# Patient Record
Sex: Male | Born: 1990
Health system: Southern US, Community
[De-identification: ages and names within clinical notes are randomized; demographics above are authoritative.]

## PROBLEM LIST (undated history)

## (undated) HISTORY — PX: FACIAL FRACTURE SURGERY: SHX1570

---

## 2015-01-07 ENCOUNTER — Encounter: Payer: Self-pay | Admitting: Emergency Medicine

## 2015-01-07 ENCOUNTER — Emergency Department
Admission: EM | Admit: 2015-01-07 | Discharge: 2015-01-08 | Disposition: A | Discharge status: Home / Self Care | Payer: Worker's Compensation | Attending: Emergency Medicine | Admitting: Emergency Medicine

## 2015-01-07 ENCOUNTER — Emergency Department: Payer: Worker's Compensation

## 2015-01-07 DIAGNOSIS — S62640B Nondisplaced fracture of proximal phalanx of right index finger, initial encounter for open fracture: Secondary | ICD-10-CM | POA: Diagnosis not present

## 2015-01-07 DIAGNOSIS — S62609B Fracture of unspecified phalanx of unspecified finger, initial encounter for open fracture: Secondary | ICD-10-CM

## 2015-01-07 DIAGNOSIS — Y9289 Other specified places as the place of occurrence of the external cause: Secondary | ICD-10-CM | POA: Diagnosis not present

## 2015-01-07 DIAGNOSIS — Z23 Encounter for immunization: Secondary | ICD-10-CM | POA: Diagnosis not present

## 2015-01-07 DIAGNOSIS — S61210A Laceration without foreign body of right index finger without damage to nail, initial encounter: Secondary | ICD-10-CM | POA: Insufficient documentation

## 2015-01-07 DIAGNOSIS — Y990 Civilian activity done for income or pay: Secondary | ICD-10-CM | POA: Insufficient documentation

## 2015-01-07 DIAGNOSIS — Y9389 Activity, other specified: Secondary | ICD-10-CM | POA: Insufficient documentation

## 2015-01-07 DIAGNOSIS — W230XXA Caught, crushed, jammed, or pinched between moving objects, initial encounter: Secondary | ICD-10-CM | POA: Insufficient documentation

## 2015-01-07 DIAGNOSIS — S6991XA Unspecified injury of right wrist, hand and finger(s), initial encounter: Secondary | ICD-10-CM | POA: Diagnosis present

## 2015-01-07 DIAGNOSIS — S61219A Laceration without foreign body of unspecified finger without damage to nail, initial encounter: Secondary | ICD-10-CM

## 2015-01-07 MED ORDER — OXYCODONE-ACETAMINOPHEN 5-325 MG PO TABS
1.0000 | ORAL_TABLET | Freq: Once | ORAL | Status: AC
  Administered 2015-01-07: 1 via ORAL

## 2015-01-07 MED ORDER — LIDOCAINE HCL (PF) 1 % IJ SOLN
INTRAMUSCULAR | Status: AC
  Filled 2015-01-07: qty 5

## 2015-01-07 MED ORDER — OXYCODONE-ACETAMINOPHEN 5-325 MG PO TABS
ORAL_TABLET | ORAL | Status: AC
  Administered 2015-01-07: 1 via ORAL
  Filled 2015-01-07: qty 1

## 2015-01-07 MED ORDER — CEFAZOLIN SODIUM 1-5 GM-% IV SOLN
1.0000 g | Freq: Once | INTRAVENOUS | Status: AC
  Administered 2015-01-07: 1 g via INTRAVENOUS

## 2015-01-07 MED ORDER — CEFAZOLIN SODIUM 1-5 GM-% IV SOLN
INTRAVENOUS | Status: AC
  Administered 2015-01-07: 1 g via INTRAVENOUS
  Filled 2015-01-07: qty 50

## 2015-01-07 NOTE — ED Provider Notes (Signed)
[facility] Emergency Department Provider Note ____________________________________________  Time seen: Approximately 11:09 PM  I have reviewed the triage vital signs and the nursing notes.   HISTORY  Chief Complaint Finger Injury   HPI Christopher Rich is a 24 y.o. male who presents to the ER for an injury to the 2nd digit of the right hand. He reports that his finger caught in a pump that was not supposed to turn on. He states that originally the finger was twisted and deformed, but he was able to "pull it back into place a little bit." He also has a laceration to the 2nd digit as well.  History reviewed. No pertinent past medical history.  There are no active problems to display for this patient.   Past Surgical History  Procedure Laterality Date  . Facial fracture surgery      Current Outpatient Rx  Name  Route  Sig  Dispense  Refill  . cephALEXin (KEFLEX) 500 MG capsule   Oral   Take 1 capsule (500 mg total) by mouth 3 (three) times daily.   40 capsule   0   . oxyCODONE-acetaminophen (ROXICET) 5-325 MG per tablet   Oral   Take 1 tablet by mouth every 6 (six) hours as needed.   12 tablet   0     Allergies Review of patient's allergies indicates no known allergies.  No family history on file.  Social History History  Substance Use Topics  . Smoking status: Never Smoker   . Smokeless tobacco: Not on file  . Alcohol Use: No    Review of Systems Constitutional: No recent illness. Eyes: No visual changes. ENT: No sore throat. Cardiovascular: Denies chest pain or palpitations. Respiratory: Denies shortness of breath. Gastrointestinal: No abdominal pain.  Genitourinary: Negative for dysuria. Musculoskeletal: Pain in right hand, 2nd digit Skin: Negative for rash. Neurological: Negative for headaches, focal weakness or numbness. 10-point ROS otherwise negative.  ____________________________________________   PHYSICAL  EXAM:  VITAL SIGNS: ED Triage Vitals  Enc Vitals Group     BP 01/07/15 2126 156/80 mmHg     Pulse Rate 01/07/15 2126 67     Resp 01/07/15 2126 18     Temp 01/07/15 2126 97.5 F (36.4 C)     Temp Source 01/07/15 2126 Oral     SpO2 01/07/15 2126 99 %     Weight 01/07/15 2126 200 lb (90.719 kg)     Height 01/07/15 2126 6\' 1"  (1.854 m)     Head Cir --      Peak Flow --      Pain Score 01/07/15 2126 4     Pain Loc --      Pain Edu? --      Excl. in GC? --     Constitutional: Alert and oriented. Well appearing and in no acute distress. Eyes: Conjunctivae are normal. EOMI. Head: Atraumatic. Nose: No congestion/rhinnorhea. Neck: No stridor.  Respiratory: Normal respiratory effort.   Musculoskeletal: Laceration to the dorsal aspect of the 2nd digit concerning for an open fracture. Second laceration at fingertip with continuous scant bleeding. Swelling and deformity noted as well. Neurologic:  Normal speech and language. No gross focal neurologic deficits are appreciated. Speech is normal. No gait instability. Skin:  Skin is warm, dry and intact. Atraumatic. Psychiatric: Mood and affect are normal. Speech and behavior are normal.  ____________________________________________   LABS (all labs ordered are listed, but only abnormal results are displayed)  Labs Reviewed - No data  to display ____________________________________________  RADIOLOGY  Image viewed by me. Oblique, displaced, slightly rotated fracture of the  Second proximal phalanx. ____________________________________________   PROCEDURES  Procedure(s) performed: LACERATION REPAIR Performed by: Kem Boroughs Authorized by: Kem Boroughs Consent: Verbal consent obtained. Risks and benefits: risks, benefits and alternatives were discussed Consent given by: patient Patient identity confirmed: provided demographic data Prepped and Draped in normal sterile fashion Wound explored  Laceration Location: Right hand,  2nd digit, fingertip  Laceration Length: 2cm  No Foreign Bodies seen or palpated  Anesthesia: local infiltration  Local anesthetic: lidocaine 1%   Anesthetic total: 3 ml  Irrigation method: syringe Amount of cleaning: standard  Skin closure: 5-0 Vicryl  Number of sutures: 6  Technique: Simple interrupted  Patient tolerance: Patient tolerated the procedure well with no immediate complications.    ____________________________________________   INITIAL IMPRESSION / ASSESSMENT AND PLAN / ED COURSE  Pertinent labs & imaging results that were available during my care of the patient were reviewed by me and considered in my medical decision making (see chart for details).  After cleaning and further exam of the laceration to the dorsal laceration, it is questionable to if the fracture is open or not. Skin is deeply abrased, but does not require closure. Will treat conservatively and give IV Ancef and continue on antibiotics outpatient.   Closure was required to fingertip--See procedure note.   Aluminum foam splint was applied to 2nd digit in functional position by ER tech. Neurovascularly intact post application. Wound care and splint care was discussed with patient and family.  He understands to call on Monday for a follow up appointment with the orthopedic doctor. He was advised to return to the ER for any symptom of concern if he is unable to see the specialist.  ____________________________________________   FINAL CLINICAL IMPRESSION(S) / ED DIAGNOSES  Final diagnoses:  Phalanx (hand) fracture, open, initial encounter  Laceration of finger of right hand, initial encounter      Chinita Pester, FNP 01/08/15 0020  Darien Ramus, MD 01/09/15 [phone]

## 2015-01-07 NOTE — ED Notes (Signed)
Pt to ED c/o R index finger injury while at work. 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

## 2015-01-08 MED ORDER — CEPHALEXIN 500 MG PO CAPS: 500.0000 mg | ORAL_CAPSULE | Freq: Three times a day (TID) | ORAL | Status: AC

## 2015-01-08 MED ORDER — TETANUS-DIPHTH-ACELL PERTUSSIS 5-2.5-18.5 LF-MCG/0.5 IM SUSP
INTRAMUSCULAR | Status: AC
  Administered 2015-01-08: 0.5 mL via INTRAMUSCULAR
  Filled 2015-01-08: qty 0.5

## 2015-01-08 MED ORDER — OXYCODONE-ACETAMINOPHEN 5-325 MG PO TABS: 1.0000 | ORAL_TABLET | Freq: Four times a day (QID) | ORAL | Status: DC | PRN

## 2015-01-08 MED ORDER — TETANUS-DIPHTH-ACELL PERTUSSIS 5-2.5-18.5 LF-MCG/0.5 IM SUSP
0.5000 mL | Freq: Once | INTRAMUSCULAR | Status: AC
  Administered 2015-01-08: 0.5 mL via INTRAMUSCULAR

## 2015-01-08 NOTE — Discharge Instructions (Signed)
Please call Monday morning to schedule a follow up appointment with the orthopedic doctor. Return to the ER for symptoms of concern if you are unable to see the specialist. Take the antibiotic as prescribed and pain medication as needed. If the dressing gets wet, please remove it and apply new. Otherwise, do not remove the splint.

## 2015-01-20 ENCOUNTER — Ambulatory Visit: Payer: Worker's Compensation | Admitting: Registered Nurse

## 2015-01-20 ENCOUNTER — Ambulatory Visit
Admission: RE | Admit: 2015-01-20 | Discharge: 2015-01-20 | Disposition: A | Discharge status: Home / Self Care | Payer: Worker's Compensation | Source: Ambulatory Visit | Attending: Surgery | Admitting: Surgery

## 2015-01-20 ENCOUNTER — Encounter: Payer: Self-pay | Admitting: *Deleted

## 2015-01-20 ENCOUNTER — Encounter
Admission: RE | Disposition: A | Discharge status: Home / Self Care | Payer: Self-pay | Source: Ambulatory Visit | Attending: Surgery

## 2015-01-20 DIAGNOSIS — S62611B Displaced fracture of proximal phalanx of left index finger, initial encounter for open fracture: Secondary | ICD-10-CM | POA: Insufficient documentation

## 2015-01-20 DIAGNOSIS — S62610A Displaced fracture of proximal phalanx of right index finger, initial encounter for closed fracture: Secondary | ICD-10-CM | POA: Diagnosis present

## 2015-01-20 DIAGNOSIS — W3189XA Contact with other specified machinery, initial encounter: Secondary | ICD-10-CM | POA: Diagnosis not present

## 2015-01-20 HISTORY — PX: OPEN REDUCTION INTERNAL FIXATION (ORIF) METACARPAL: SHX6234

## 2015-01-20 SURGERY — OPEN REDUCTION INTERNAL FIXATION (ORIF) METACARPAL
Anesthesia: General | Laterality: Right | Wound class: Clean

## 2015-01-20 MED ORDER — FENTANYL CITRATE (PF) 100 MCG/2ML IJ SOLN
INTRAMUSCULAR | Status: AC
  Administered 2015-01-20: 25 ug via INTRAVENOUS
  Filled 2015-01-20: qty 2

## 2015-01-20 MED ORDER — BUPIVACAINE HCL (PF) 0.5 % IJ SOLN
INTRAMUSCULAR | Status: DC | PRN
  Administered 2015-01-20: 10 mL

## 2015-01-20 MED ORDER — KETOROLAC TROMETHAMINE 30 MG/ML IJ SOLN
INTRAMUSCULAR | Status: DC | PRN
  Administered 2015-01-20: 30 mg via INTRAVENOUS

## 2015-01-20 MED ORDER — OXYCODONE HCL 5 MG PO TABS: 5.0000 mg | ORAL_TABLET | ORAL | Status: DC | PRN

## 2015-01-20 MED ORDER — FAMOTIDINE 20 MG PO TABS
ORAL_TABLET | ORAL | Status: AC
  Administered 2015-01-20: 20 mg
  Filled 2015-01-20: qty 1

## 2015-01-20 MED ORDER — CEFAZOLIN SODIUM-DEXTROSE 2-3 GM-% IV SOLR
2.0000 g | Freq: Once | INTRAVENOUS | Status: AC
  Administered 2015-01-20: 2 g via INTRAVENOUS

## 2015-01-20 MED ORDER — OXYCODONE HCL 5 MG PO TABS
ORAL_TABLET | ORAL | Status: AC
  Administered 2015-01-20: 5 mg via ORAL
  Filled 2015-01-20: qty 1

## 2015-01-20 MED ORDER — ACETAMINOPHEN 10 MG/ML IV SOLN
INTRAVENOUS | Status: DC | PRN
  Administered 2015-01-20: 1000 mg via INTRAVENOUS

## 2015-01-20 MED ORDER — ONDANSETRON HCL 4 MG/2ML IJ SOLN
INTRAMUSCULAR | Status: DC | PRN
Start: 2015-01-20 — End: 2015-01-20
  Administered 2015-01-20: 4 mg via INTRAVENOUS

## 2015-01-20 MED ORDER — ONDANSETRON HCL 4 MG/2ML IJ SOLN: 4.0000 mg | Freq: Once | INTRAMUSCULAR | Status: DC | PRN

## 2015-01-20 MED ORDER — LIDOCAINE HCL 2 % EX GEL
CUTANEOUS | Status: DC | PRN
  Administered 2015-01-20: 1 via TOPICAL

## 2015-01-20 MED ORDER — PROPOFOL 10 MG/ML IV BOLUS
INTRAVENOUS | Status: DC | PRN
  Administered 2015-01-20: 100 mg via INTRAVENOUS
  Administered 2015-01-20: 50 mg via INTRAVENOUS
  Administered 2015-01-20: 200 mg via INTRAVENOUS

## 2015-01-20 MED ORDER — CEFAZOLIN SODIUM-DEXTROSE 2-3 GM-% IV SOLR
INTRAVENOUS | Status: AC
  Filled 2015-01-20: qty 50

## 2015-01-20 MED ORDER — NEOMYCIN-POLYMYXIN B GU 40-200000 IR SOLN
Status: DC | PRN
  Administered 2015-01-20: 2 mL

## 2015-01-20 MED ORDER — GLYCOPYRROLATE 0.2 MG/ML IJ SOLN
INTRAMUSCULAR | Status: DC | PRN
  Administered 2015-01-20: 0.2 mg via INTRAVENOUS

## 2015-01-20 MED ORDER — ACETAMINOPHEN 10 MG/ML IV SOLN
INTRAVENOUS | Status: AC
  Filled 2015-01-20: qty 100

## 2015-01-20 MED ORDER — FAMOTIDINE 20 MG PO TABS: 20.0000 mg | ORAL_TABLET | Freq: Once | ORAL | Status: DC

## 2015-01-20 MED ORDER — FENTANYL CITRATE (PF) 100 MCG/2ML IJ SOLN
INTRAMUSCULAR | Status: DC | PRN
  Administered 2015-01-20 (×2): 50 ug via INTRAVENOUS

## 2015-01-20 MED ORDER — LACTATED RINGERS IV SOLN
INTRAVENOUS | Status: DC
  Administered 2015-01-20 (×2): via INTRAVENOUS

## 2015-01-20 MED ORDER — MIDAZOLAM HCL 2 MG/2ML IJ SOLN
INTRAMUSCULAR | Status: DC | PRN
  Administered 2015-01-20: 2 mg via INTRAVENOUS

## 2015-01-20 MED ORDER — METOPROLOL TARTRATE 1 MG/ML IV SOLN
INTRAVENOUS | Status: DC | PRN
  Administered 2015-01-20: 5 mg via INTRAVENOUS

## 2015-01-20 MED ORDER — SUCCINYLCHOLINE CHLORIDE 20 MG/ML IJ SOLN
INTRAMUSCULAR | Status: DC | PRN
  Administered 2015-01-20: 100 mg via INTRAVENOUS
  Administered 2015-01-20: 10 mg via INTRAVENOUS

## 2015-01-20 MED ORDER — LIDOCAINE HCL (CARDIAC) 20 MG/ML IV SOLN
INTRAVENOUS | Status: DC | PRN
  Administered 2015-01-20: 100 mg via INTRAVENOUS

## 2015-01-20 MED ORDER — BUPIVACAINE HCL (PF) 0.5 % IJ SOLN
INTRAMUSCULAR | Status: AC
  Filled 2015-01-20: qty 30

## 2015-01-20 MED ORDER — OXYCODONE HCL 5 MG PO TABS
5.0000 mg | ORAL_TABLET | ORAL | Status: DC | PRN
  Administered 2015-01-20: 5 mg via ORAL

## 2015-01-20 MED ORDER — FENTANYL CITRATE (PF) 100 MCG/2ML IJ SOLN
25.0000 ug | INTRAMUSCULAR | Status: DC | PRN
  Administered 2015-01-20 (×4): 25 ug via INTRAVENOUS

## 2015-01-20 MED ORDER — DEXAMETHASONE SODIUM PHOSPHATE 4 MG/ML IJ SOLN
INTRAMUSCULAR | Status: DC | PRN
  Administered 2015-01-20: 10 mg via INTRAVENOUS

## 2015-01-20 SURGICAL SUPPLY — 37 items
1.5 COUNTER SINK ×3 IMPLANT
BANDAGE STRETCH 3X4.1 STRL (GAUZE/BANDAGES/DRESSINGS) ×3 IMPLANT
BIT DRILL 1.1 (BIT) ×1
BIT DRILL 1.1MM (BIT) ×1
BIT DRILL 60X20X1.1XQC TMX (BIT) ×1 IMPLANT
BIT DRL 60X20X1.1XQC TMX (BIT) ×1
BNDG ESMARK 4X12 TAN STRL LF (GAUZE/BANDAGES/DRESSINGS) ×3 IMPLANT
BNDG GAUZE 1X2.1 STRL (MISCELLANEOUS) ×3 IMPLANT
CHLORAPREP W/TINT 26ML (MISCELLANEOUS) ×3 IMPLANT
CORD BIP STRL DISP 12FT (MISCELLANEOUS) ×3 IMPLANT
COUNTERSINK 1.3MM/1.5MM (INSTRUMENTS) ×3
DRAPE FLUOR MINI C-ARM 54X84 (DRAPES) ×3 IMPLANT
DRIVER BIT 1.5 (TRAUMA) ×3 IMPLANT
FORCEPS JEWEL BIP 4-3/4 STR (INSTRUMENTS) ×3 IMPLANT
GAUZE PETRO XEROFOAM 1X8 (MISCELLANEOUS) ×3 IMPLANT
GAUZE SPONGE 4X4 12PLY STRL (GAUZE/BANDAGES/DRESSINGS) ×3 IMPLANT
GLOVE BIO SURGEON STRL SZ8 (GLOVE) ×6 IMPLANT
GLOVE INDICATOR 8.0 STRL GRN (GLOVE) ×3 IMPLANT
GOWN STRL REUS W/ TWL LRG LVL3 (GOWN DISPOSABLE) ×1 IMPLANT
GOWN STRL REUS W/ TWL XL LVL3 (GOWN DISPOSABLE) ×1 IMPLANT
GOWN STRL REUS W/TWL LRG LVL3 (GOWN DISPOSABLE) ×2
GOWN STRL REUS W/TWL XL LVL3 (GOWN DISPOSABLE) ×2
NDL SAFETY 25GX1.5 (NEEDLE) ×3 IMPLANT
NS IRRIG 500ML POUR BTL (IV SOLUTION) ×3 IMPLANT
PACK EXTREMITY ARMC (MISCELLANEOUS) ×3 IMPLANT
PASSER SUT SWANSON 36MM LOOP (INSTRUMENTS) IMPLANT
SCREW 1.5X15MM (Screw) ×3 IMPLANT
SCREW COUNTERSINK 1.3MM/1.5MM (INSTRUMENTS) ×1 IMPLANT
SCREW NONIOC 1.5 14M (Screw) IMPLANT
SCREW NONIOC 1.5 16M (Screw) ×6 IMPLANT
STOCKINETTE STRL 4IN 9604848 (GAUZE/BANDAGES/DRESSINGS) IMPLANT
STRAP SAFETY BODY (MISCELLANEOUS) ×3 IMPLANT
SUT PROLENE 4 0 PS 2 18 (SUTURE) ×3 IMPLANT
SUT PROLENE 6 0 P 1 18 (SUTURE) ×3 IMPLANT
SUT VIC AB 5-0 TF 27 (SUTURE) ×3 IMPLANT
SYRINGE 10CC LL (SYRINGE) ×3 IMPLANT
WIRE Z .062 C-WIRE SPADE TIP (WIRE) IMPLANT

## 2015-01-20 NOTE — Transfer of Care (Signed)
Immediate Anesthesia Transfer of Care Note  Patient: Christopher Rich  Procedure(s) Performed: Procedure(s): OPEN REDUCTION INTERNAL FIXATION (ORIF) Right index proximal pharanx (Right)  Patient Location: PACU  Anesthesia Type:General  Level of Consciousness: sedated  Airway & Oxygen Therapy: Patient Spontanous Breathing and Patient connected to face mask oxygen  Post-op Assessment: Report given to RN and Post -op Vital signs reviewed and stable  Post vital signs: Reviewed and stable  Last Vitals:  Filed Vitals:   01/20/15 0914  BP: 126/82  Pulse: 74  Temp: 36 C  Resp: 16    Complications: No apparent anesthesia complications

## 2015-01-20 NOTE — Addendum Note (Signed)
Addendum  created 01/20/15 1523 by Stormy FabianLinda Kalea Perine, CRNA   Modules edited: Charges VN

## 2015-01-20 NOTE — Anesthesia Procedure Notes (Addendum)
Procedure Name: LMA Insertion Date/Time: 01/20/2015 7:14 AM Performed by: Stormy FabianURTIS, Matther Labell Pre-anesthesia Checklist: Patient identified, Patient being monitored, Timeout performed, Emergency Drugs available and Suction available Patient Re-evaluated:Patient Re-evaluated prior to inductionOxygen Delivery Method: Circle system utilized Preoxygenation: Pre-oxygenation with 100% oxygen Intubation Type: IV induction Ventilation: Mask ventilation without difficulty LMA: LMA inserted LMA Size: 4.5 Tube type: Oral Number of attempts: 1 Placement Confirmation: positive ETCO2 and breath sounds checked- equal and bilateral Tube secured with: Tape Dental Injury: Teeth and Oropharynx as per pre-operative assessment    Procedure Name: Intubation Date/Time: 01/20/2015 7:19 AM Performed by: Stormy FabianURTIS, Maximilliano Kersh Pre-anesthesia Checklist: Patient identified, Patient being monitored, Timeout performed, Emergency Drugs available and Suction available Patient Re-evaluated:Patient Re-evaluated prior to inductionOxygen Delivery Method: Circle system utilized Preoxygenation: Pre-oxygenation with 100% oxygen Intubation Type: IV induction Ventilation: Mask ventilation without difficulty Laryngoscope Size: Mac and 3 Grade View: Grade I Tube type: Oral Tube size: 7.5 mm Number of attempts: 1 Airway Equipment and Method: Stylet Placement Confirmation: ETT inserted through vocal cords under direct vision,  positive ETCO2 and breath sounds checked- equal and bilateral Secured at: 22 cm Tube secured with: Tape Dental Injury: Teeth and Oropharynx as per pre-operative assessment  Comments: Patient had a laryngospasm- LMA removed. Patient intubated without difficulty. Patient has an anterior airway

## 2015-01-20 NOTE — Discharge Instructions (Addendum)
Keep splint dry and intact. Keep hand elevated above heart level. Apply ice to affected area frequently. Return for follow-up in 10-14 days or as scheduled.   AMBULATORY SURGERY  DISCHARGE INSTRUCTIONS   1) The drugs that you were given will stay in your system until tomorrow so for the next 24 hours you should not:  A) Drive an automobile B) Make any legal decisions C) Drink any alcoholic beverage   2) You may resume regular meals tomorrow.  Today it is better to start with liquids and gradually work up to solid foods.  You may eat anything you prefer, but it is better to start with liquids, then soup and crackers, and gradually work up to solid foods.   3) Please notify your doctor immediately if you have any unusual bleeding, trouble breathing, redness and pain at the surgery site, drainage, fever, or pain not relieved by medication.  4) Your post-operative visit with Dr.                                     is: Date:                        Time:    Please call to schedule your post-operative visit.  5) Additional Instructions: 6)

## 2015-01-20 NOTE — H&P (Signed)
Paper H&P to be scanned into permanent record. H&P reviewed. No changes. 

## 2015-01-20 NOTE — Op Note (Signed)
01/20/2015  8:58 AM  Patient:   Christopher Rich  Pre-Op Diagnosis:   Grade 1 open displaced fracture right index proximal phalanx.  Post-Op Diagnosis:   Same  Procedure:   Open reduction and internal fixation of right index P1 fracture.  Surgeon:   Maryagnes AmosJ. Jeffrey Poggi, MD  Assistant:   None  Anesthesia:   General endotracheal  Findings:   As above.  Complications:   None  EBL:   1 cc  Fluids:   1000 cc crystalloid  TT:   70 minutes at 250 mmHg  Drains:   None  Closure:   40 proline interrupted sutures  Implants:   Biomet 1.5 mm nonlocking mini-frag screws 3  Brief Clinical Note:   The patient is a 24 year old male who sustained the above-noted injury 13 days ago secondary to a work-related injury. He was seen in the emergency room where x-rays demonstrated the above-noted fracture. He was splinted and given antibiotics. He presents at this time for definitive management of his injury.  Procedure:   The patient was brought into the operating room and lain in the supine position. After an attempt at general laryngal mask anesthesia was performed, the patient developed some laryngospasm. Therefore, the anesthetic was switched to general endotracheal intubation and anesthesia. After site verification, the right hand and upper extremity were prepped with DuraPrep solution before being draped sterilely. Preoperative antibiotics were administered. A curvilinear incision was made over the dorsal aspect of the index finger beginning at the PIP extension crease and extending to just distal to the MCP extension crease. The incision was carried down through the subcutaneous tissues with care taken to identify and protect as many dorsal veins as possible. The extensor tendon was incised longitudinally and retracted radially and ulnarly respectively. The underlying periosteum also was divided longitudinally to expose the fracture site. The fracture hematoma was debrided using a mini curet and small  pickups, as well as some irrigation before the fracture was reduced and temporarily secured using bone-holding tenaculums. The adequacy of fracture reduction was verified using Orthoscan imaging in AP, oblique, and lateral projections. After several minor adjustments, satisfactory reduction was achieved. The fracture was secured using three radially to ulnarly directed Biomet 1.5 mm nonlocking mini frag cortical screws. The adequacy of screw position and fracture reduction again was verified using the Orthoscan imager in AP, lateral, and oblique projections and found to be excellent. Finger alignment and rotation also was verified by flexing the digit.  The wound was copiously irrigated with sterile saline solution before the periosteal layer was reapproximated using 5-0 Vicryl interrupted sutures. The extensor tendon itself was reapproximated using a running 5-0 proline suture. The skin was closed using 4-0 proline interrupted sutures. A sterile bulky dressing was applied to the incision before the index and long fingers were buddy taped and splinted with the PIP and DIP joints in extension, yet permitting MCP flexion. The patient was then awakened, extubated, and returned to the recovery room in satisfactory condition after tolerating the procedure well.

## 2015-01-20 NOTE — Progress Notes (Signed)
Ice pack applied to right hand, elevated on pillow.

## 2015-01-20 NOTE — Anesthesia Preprocedure Evaluation (Signed)
Anesthesia Evaluation  Patient identified by MRN, date of birth, ID band Patient awake    Reviewed: Allergy & Precautions, NPO status , Patient's Chart, lab work & pertinent test results  Airway Mallampati: I  TM Distance: >3 FB Neck ROM: Full    Dental no notable dental hx.    Pulmonary neg pulmonary ROS,    Pulmonary exam normal       Cardiovascular negative cardio ROS Normal cardiovascular exam    Neuro/Psych negative neurological ROS  negative psych ROS   GI/Hepatic negative GI ROS, Neg liver ROS,   Endo/Other  negative endocrine ROS  Renal/GU negative Renal ROS  negative genitourinary   Musculoskeletal negative musculoskeletal ROS (+)   Abdominal Normal abdominal exam  (+)   Peds negative pediatric ROS (+)  Hematology negative hematology ROS (+)   Anesthesia Other Findings   Reproductive/Obstetrics                             Anesthesia Physical Anesthesia Plan  ASA: I  Anesthesia Plan: General   Post-op Pain Management:    Induction: Intravenous  Airway Management Planned: LMA  Additional Equipment:   Intra-op Plan:   Post-operative Plan: Extubation in OR  Informed Consent: I have reviewed the patients History and Physical, chart, labs and discussed the procedure including the risks, benefits and alternatives for the proposed anesthesia with the patient or authorized representative who has indicated his/her understanding and acceptance.   Dental advisory given  Plan Discussed with: CRNA and Surgeon  Anesthesia Plan Comments:         Anesthesia Quick Evaluation

## 2015-01-20 NOTE — Anesthesia Postprocedure Evaluation (Signed)
  Anesthesia Post-op Note  Patient: Christopher Rich  Procedure(s) Performed: Procedure(s): OPEN REDUCTION INTERNAL FIXATION (ORIF) Right index proximal pharanx (Right)  Anesthesia type:General  Patient location: PACU  Post pain: Pain level controlled  Post assessment: Post-op Vital signs reviewed, Patient's Cardiovascular Status Stable, Respiratory Function Stable, Patent Airway and No signs of Nausea or vomiting  Post vital signs: Reviewed and stable  Last Vitals:  Filed Vitals:   01/20/15 1037  BP: 125/79  Pulse:   Temp:   Resp: 16    Level of consciousness: awake, alert  and patient cooperative  Complications: No apparent anesthesia complications... Mild partial laryngospasm at start of case resolved with positive pressure... Pt did well

## 2015-01-21 ENCOUNTER — Encounter: Payer: Self-pay | Admitting: Surgery

## 2017-02-22 IMAGING — CR DG FINGER INDEX 2+V*R*
1 series · 3 of 3 positions shown · non-contrast
Comparison: None.

CLINICAL DATA: RIGHT finger injury at work. Caught finger in moving
machine part.

EXAM:
RIGHT INDEX FINGER 2+V

[Series 1: x finger pa right · 0.14mm/px · 3 of 3 slices shown]
[im 1/3]
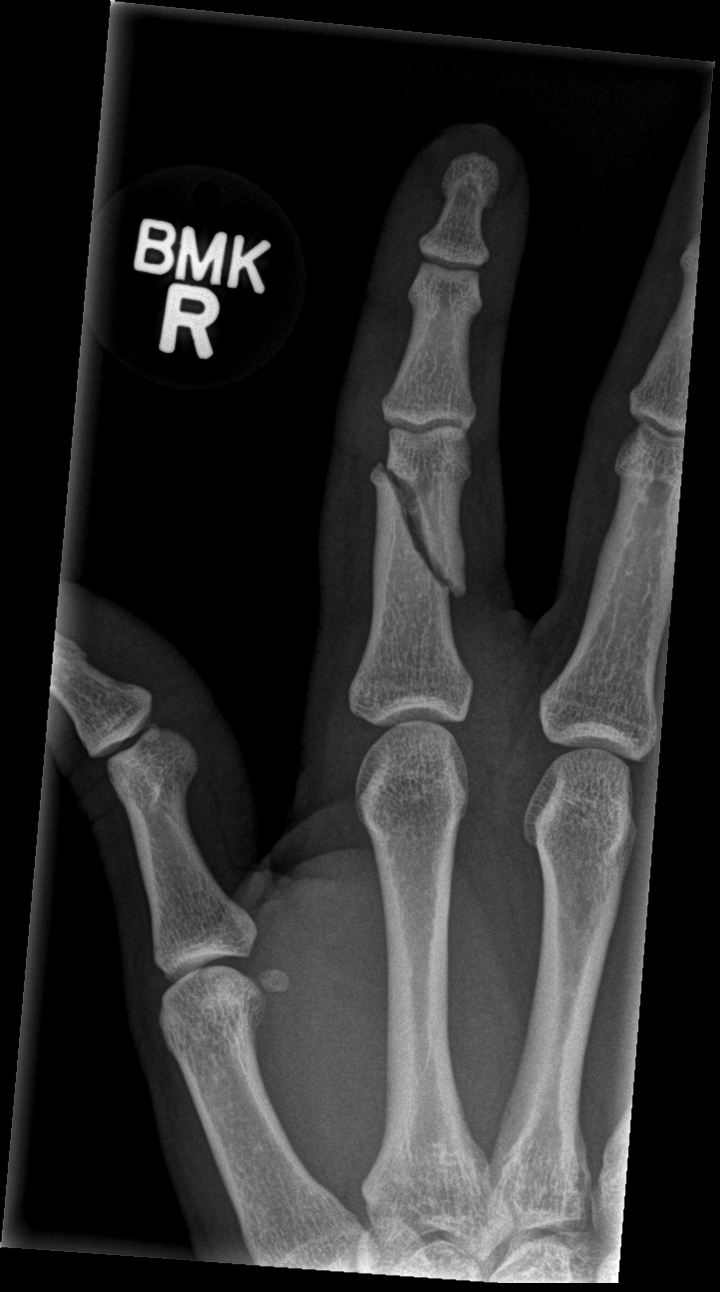
[im 2/3]
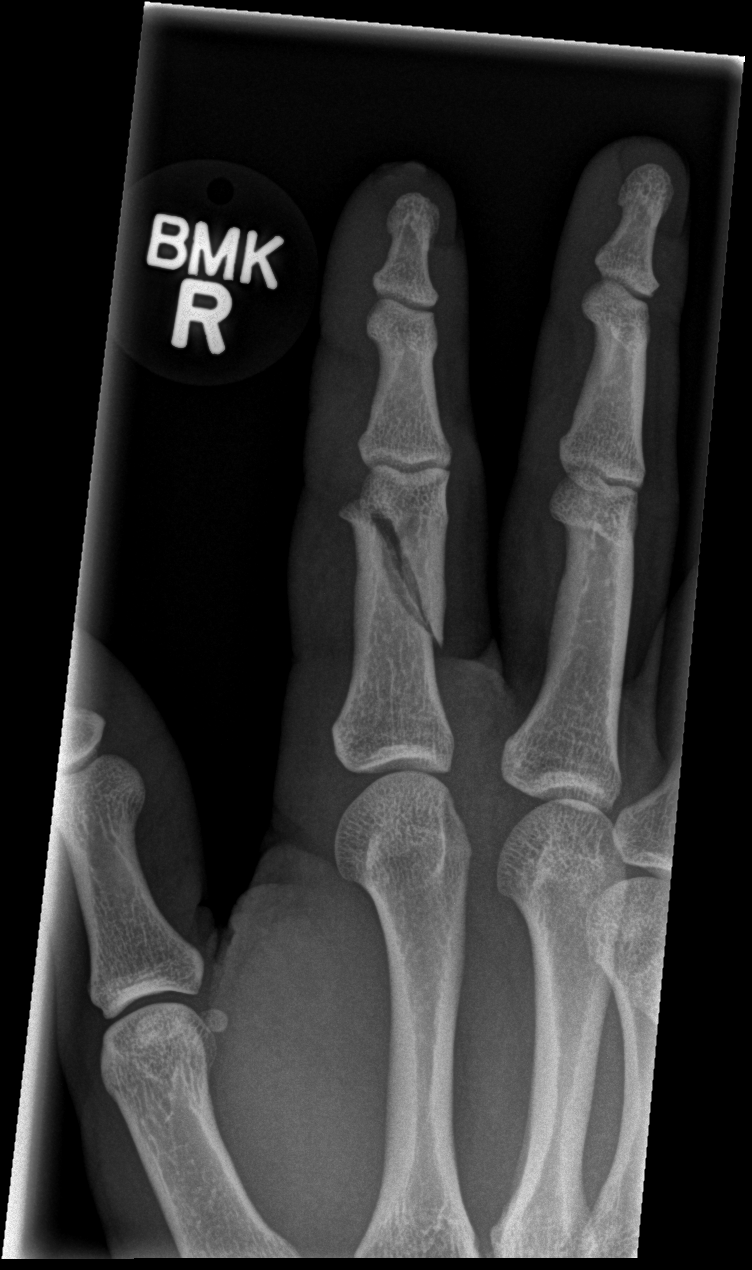
[im 3/3]
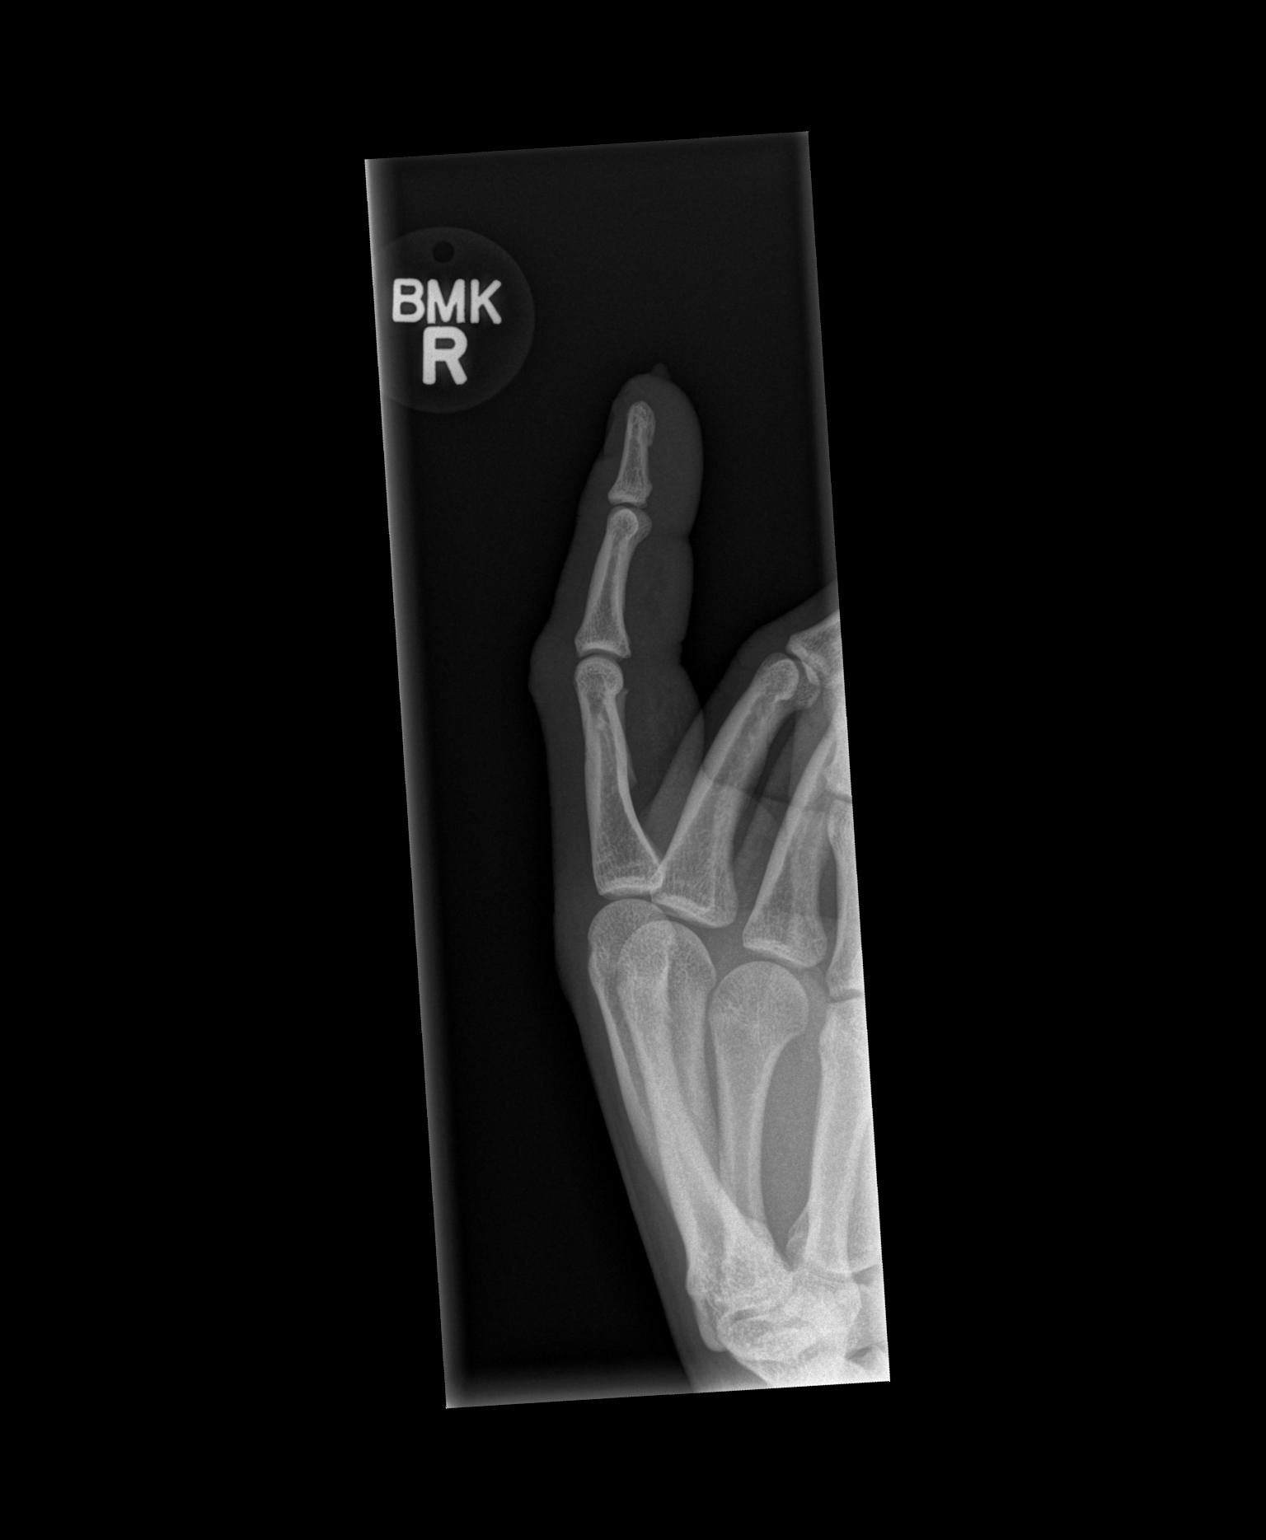

[3 of 3 positions shown; findings below may reference images not displayed]

FINDINGS: Oblique mildly impacted nondisplaced second proximal phalanx
fracture without intra-articular extension. Mild apparent bony
reabsorption of the fracture line. No dislocation. No destructive
bony lesions. Second digit tuft soft tissue irregularity may reflect
laceration without subcutaneous gas nor radiopaque foreign bodies.
IMPRESSION: Acute to subacute appearing nondisplaced second proximal phalanx
fracture without dislocation.

Suspected tuft soft tissue defect likely representing laceration
without radiopaque foreign bodies.

## 2020-10-20 ENCOUNTER — Other Ambulatory Visit: Payer: Self-pay

## 2020-10-20 ENCOUNTER — Emergency Department
Admission: RE | Admit: 2020-10-20 | Discharge: 2020-10-20 | Disposition: A | Discharge status: Home / Self Care | Payer: Self-pay | Source: Ambulatory Visit | Attending: Emergency Medicine | Admitting: Emergency Medicine

## 2020-10-20 VITALS — BP 136/90 | HR 63 | Temp 97.6°F | Resp 17

## 2020-10-20 DIAGNOSIS — T300 Burn of unspecified body region, unspecified degree: Secondary | ICD-10-CM | POA: Diagnosis not present

## 2020-10-20 DIAGNOSIS — Z23 Encounter for immunization: Secondary | ICD-10-CM

## 2020-10-20 MED ORDER — IBUPROFEN 600 MG PO TABS: 600.0000 mg | ORAL_TABLET | Freq: Four times a day (QID) | ORAL | 0 refills | Status: DC | PRN

## 2020-10-20 MED ORDER — HYDROCODONE-ACETAMINOPHEN 5-325 MG PO TABS: 1.0000 | ORAL_TABLET | Freq: Four times a day (QID) | ORAL | 0 refills | Status: DC | PRN

## 2020-10-20 MED ORDER — TETANUS-DIPHTH-ACELL PERTUSSIS 5-2.5-18.5 LF-MCG/0.5 IM SUSY
0.5000 mL | PREFILLED_SYRINGE | Freq: Once | INTRAMUSCULAR | Status: AC
  Administered 2020-10-20: 0.5 mL via INTRAMUSCULAR
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
## 2020-10-20 NOTE — Discharge Instructions (Addendum)
I have updated your tetanus.  You may take 600 mg of ibuprofen combined with Tylenol-containing product 3-4 times a day.  Either ibuprofen with 1000 mg of Tylenol for mild to moderate pain, or ibuprofen with 1-2 Norco for severe pain or for dressing changes.  Do not take Tylenol and Norco at the same time because they both have Tylenol in them and too much Tylenol can hurt your liver.  You can put bacitracin on this to help prevent it from sticking. Follow-up with the [facility] burn center ASAP.  This is going to take a while to heal.
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
## 2022-04-15 ENCOUNTER — Other Ambulatory Visit: Payer: Self-pay

## 2022-04-15 ENCOUNTER — Emergency Department
Admission: EM | Admit: 2022-04-15 | Discharge: 2022-04-15 | Disposition: A | Discharge status: Home / Self Care | Payer: No Typology Code available for payment source | Attending: Emergency Medicine | Admitting: Emergency Medicine

## 2022-04-15 ENCOUNTER — Encounter: Payer: Self-pay | Admitting: Intensive Care

## 2022-04-15 DIAGNOSIS — J020 Streptococcal pharyngitis: Secondary | ICD-10-CM | POA: Insufficient documentation

## 2022-04-15 DIAGNOSIS — R509 Fever, unspecified: Secondary | ICD-10-CM | POA: Diagnosis not present

## 2022-04-15 DIAGNOSIS — J029 Acute pharyngitis, unspecified: Secondary | ICD-10-CM | POA: Diagnosis present

## 2022-04-15 LAB — GROUP A STREP BY PCR: Group A Strep by PCR: DETECTED — AB

## 2022-04-15 MED ORDER — PENICILLIN G BENZATHINE 1200000 UNIT/2ML IM SUSY
1.2000 10*6.[IU] | PREFILLED_SYRINGE | Freq: Once | INTRAMUSCULAR | Status: AC
  Administered 2022-04-15: 1.2 10*6.[IU] via INTRAMUSCULAR
  Filled 2022-04-15: qty 2

## 2022-04-15 NOTE — ED Triage Notes (Signed)
Patient c/o sore throat and fever last night. Noted white spots on throat. HX strep

## 2022-04-15 NOTE — ED Provider Notes (Signed)
[facility] Emergency Department Provider Note     None    (approximate)   History   Sore Throat   HPI  Christopher Rich is a 31 y.o. male with a history of strep throat, presents to the ED for evaluation of sore throat and fever onset last night. He reports a tmax of 101F last night.    Physical Exam   Triage Vital Signs: ED Triage Vitals  Enc Vitals Group     BP 04/15/22 1505 (!) 139/98     Pulse Rate 04/15/22 1505 (!) 104     Resp 04/15/22 1505 16     Temp 04/15/22 1505 99.1 F (37.3 C)     Temp Source 04/15/22 1505 Oral     SpO2 04/15/22 1505 96 %     Weight 04/15/22 1506 215 lb (97.5 kg)     Height 04/15/22 1506 6\' 1"  (1.854 m)     Head Circumference --      Peak Flow --      Pain Score 04/15/22 1505 5     Pain Loc --      Pain Edu? --      Excl. in GC? --     Most recent vital signs: Vitals:   04/15/22 1505 04/15/22 1633  BP: (!) 139/98   Pulse: (!) 104 81  Resp: 16 16  Temp: 99.1 F (37.3 C)   SpO2: 96% 100%    General Awake, no distress. NAD HEENT NCAT. PERRL. EOMI. No rhinorrhea. Mucous membranes are moist. TMs clear bilaterally. Uvula is midline and tonsils are erythematous and with bilateral exudates noted.  CV:  Good peripheral perfusion.  RESP:  Normal effort.  ABD:  No distention.    ED Results / Procedures / Treatments   Labs (all labs ordered are listed, but only abnormal results are displayed) Labs Reviewed  GROUP A STREP BY PCR - Abnormal; Notable for the following components:      Result Value   Group A Strep by PCR DETECTED (*)    All other components within normal limits     EKG   RADIOLOGY   No results found.   PROCEDURES:  Critical Care performed: No  Procedures   MEDICATIONS ORDERED IN ED: Medications  penicillin g benzathine (BICILLIN LA) 1200000 UNIT/2ML injection 1.2 Million Units (1.2 Million Units Intramuscular Given 04/15/22 1634)     IMPRESSION / MDM / ASSESSMENT AND PLAN  / ED COURSE  I reviewed the triage vital signs and the nursing notes.                              Differential diagnosis includes, but is not limited to, AOM, viral URI, strep throat, PTA  Patient's presentation is most consistent with acute complicated illness / injury requiring diagnostic workup.  Patient's diagnosis is consistent with strep tonsillitis. Patient will be discharged home with care instructions. He is treated in the ED with a single dose of Penicillin G benzanthine. Patient is to follow up with a local urgent care center, as needed or otherwise directed. Patient is given ED precautions to return to the ED for any worsening or new symptoms.     FINAL CLINICAL IMPRESSION(S) / ED DIAGNOSES   Final diagnoses:  Strep throat     Rx / DC Orders   ED Discharge Orders     None        Note:  This document was prepared using Dragon voice recognition software and may include unintentional dictation errors.    Melvenia Needles, PA-C 04/15/22 1646    Duffy Bruce, MD 04/15/22 2227

## 2022-04-15 NOTE — ED Notes (Signed)
Pt A&Ox4 ambulatory at d/c with independent steady gait. Pt verbalized understanding of d/c instructions and follow up care. 

## 2022-04-15 NOTE — Discharge Instructions (Addendum)
You are being treated for strep throat. You have been given a single dose of penicillin in the emergency department. Continue to monitor and treat fevers.

## 2022-04-16 ENCOUNTER — Ambulatory Visit: Payer: No Typology Code available for payment source

## 2023-01-24 ENCOUNTER — Ambulatory Visit
Admission: EM | Admit: 2023-01-24 | Discharge: 2023-01-24 | Disposition: A | Discharge status: Home / Self Care | Payer: 59 | Attending: Urgent Care | Admitting: Urgent Care

## 2023-01-24 ENCOUNTER — Encounter: Payer: Self-pay | Admitting: Emergency Medicine

## 2023-01-24 DIAGNOSIS — J029 Acute pharyngitis, unspecified: Secondary | ICD-10-CM | POA: Insufficient documentation

## 2023-01-24 LAB — POCT RAPID STREP A (OFFICE): Rapid Strep A Screen: NEGATIVE

## 2023-01-24 MED ORDER — AMOXICILLIN 500 MG PO CAPS: 500.0000 mg | ORAL_CAPSULE | Freq: Two times a day (BID) | ORAL | 0 refills | Status: AC

## 2023-01-24 MED ORDER — PREDNISONE 10 MG (21) PO TBPK: ORAL_TABLET | Freq: Every day | ORAL | 0 refills | Status: AC

## 2023-01-24 NOTE — ED Provider Notes (Signed)
Christopher Rich    CSN: [id number] Arrival date & time: 01/24/23  1830      History   Chief Complaint Chief Complaint  Patient presents with   Sore Throat    HPI Christopher Rich is a 32 y.o. male.    Sore Throat  Accompanied by multiple family members with similar symptoms.  Endorses sore throat x 3 days accompanied by laryngitis.  Pain is severe and worsening.  Taking Tylenol to treat his symptoms.  History reviewed. No pertinent past medical history.  There are no problems to display for this patient.   Past Surgical History:  Procedure Laterality Date   FACIAL FRACTURE SURGERY     OPEN REDUCTION INTERNAL FIXATION (ORIF) METACARPAL Right 01/20/2015   Procedure: OPEN REDUCTION INTERNAL FIXATION (ORIF) Right index proximal pharanx;  Surgeon: Christena Flake, MD;  Location: [facility] ORS;  Service: Orthopedics;  Laterality: Right;       Home Medications    Prior to Admission medications   Not on File    Family History History reviewed. No pertinent family history.  Social History Social History   Tobacco Use   Smoking status: Never   Smokeless tobacco: Never  Vaping Use   Vaping status: Never Used  Substance Use Topics   Alcohol use: Yes   Drug use: No     Allergies   Patient has no known allergies.   Review of Systems Review of Systems   Physical Exam Triage Vital Signs ED Triage Vitals [01/24/23 1904]  Encounter Vitals Group     BP      Systolic BP Percentile      Diastolic BP Percentile      Pulse      Resp      Temp      Temp src      SpO2      Weight      Height      Head Circumference      Peak Flow      Pain Score 7     Pain Loc      Pain Education      Exclude from Growth Chart    No data found.  Updated Vital Signs There were no vitals taken for this visit.  Visual Acuity Right Eye Distance:   Left Eye Distance:   Bilateral Distance:    Right Eye Near:   Left Eye Near:    Bilateral Near:     Physical  Exam Vitals reviewed.  Constitutional:      Appearance: He is well-developed.  HENT:     Mouth/Throat:     Pharynx: Posterior oropharyngeal erythema present. No pharyngeal swelling, oropharyngeal exudate or uvula swelling.     Tonsils: No tonsillar exudate.   Skin:    General: Skin is warm and dry.  Neurological:     General: No focal deficit present.     Mental Status: He is alert.  Psychiatric:        Mood and Affect: Mood normal.        Behavior: Behavior normal.      UC Treatments / Results  Labs (all labs ordered are listed, but only abnormal results are displayed) Labs Reviewed  POCT RAPID STREP A (OFFICE)    EKG   Radiology No results found.  Procedures Procedures (including critical care time)  Medications Ordered in UC Medications - No data to display  Initial Impression / Assessment and Plan / UC Course  I have  reviewed the triage vital signs and the nursing notes.  Pertinent labs & imaging results that were available during my care of the patient were reviewed by me and considered in my medical decision making (see chart for details).   Christopher Rich is a 32 y.o. male presenting with acute pharyngitis. Patient is afebrile without recent antipyretics, satting well on room air. Overall is well appearing, well hydrated, without respiratory distress.  Pharyngeal erythema is present.  No peritonsillar exudate.  There appears to be a small ulceration on his uvula.  Reviewed relevant chart history. Additional history obtained from patient family/caregiver present during the exam.  Rapid strep result is negative.  Will send confirmatory culture.  Treating presumptively for strep with amoxicillin.  Also prescribing prednisone taper given his acute symptoms of inflammation and pain.  Recommending supportive care and use of OTC medication for symptom control.  Counseled patient on potential for adverse effects with medications prescribed/recommended today, ER and  return-to-clinic precautions discussed, patient verbalized understanding and agreement with care plan.   Final Clinical Impressions(s) / UC Diagnoses   Final diagnoses:  Acute pharyngitis, unspecified etiology     Discharge Instructions      Follow up here or with your primary care provider if your symptoms are worsening or not improving with treatment.      ED Prescriptions   None    PDMP not reviewed this encounter.   Charma Igo, [place] 01/24/23 1955

## 2023-01-24 NOTE — Discharge Instructions (Addendum)
Follow up here or with your primary care provider if your symptoms are worsening or not improving with treatment.     

## 2023-01-24 NOTE — ED Triage Notes (Signed)
Sore throat x 3 days, unable to speak d/t pain. Taking tylenol at home

## 2023-01-28 LAB — CULTURE, GROUP A STREP (THRC)

## 2023-02-04 ENCOUNTER — Telehealth (HOSPITAL_COMMUNITY): Payer: Self-pay | Admitting: Emergency Medicine

## 2023-02-04 NOTE — Telephone Encounter (Signed)
Patient returned calls about his strep culture.  Reviewed, no questions at this time

## 2023-08-23 ENCOUNTER — Ambulatory Visit
Admission: EM | Admit: 2023-08-23 | Discharge: 2023-08-23 | Disposition: A | Discharge status: Home / Self Care | Payer: BC Managed Care – PPO | Attending: Emergency Medicine | Admitting: Emergency Medicine

## 2023-08-23 DIAGNOSIS — J069 Acute upper respiratory infection, unspecified: Secondary | ICD-10-CM | POA: Diagnosis not present

## 2023-08-23 LAB — POC COVID19/FLU A&B COMBO
Covid Antigen, POC: NEGATIVE
Influenza A Antigen, POC: NEGATIVE
Influenza B Antigen, POC: NEGATIVE

## 2023-08-23 LAB — POCT RAPID STREP A (OFFICE): Rapid Strep A Screen: NEGATIVE

## 2023-08-23 MED ORDER — AZITHROMYCIN 250 MG PO TABS: 250.0000 mg | ORAL_TABLET | Freq: Every day | ORAL | 0 refills | Status: AC

## 2023-08-23 NOTE — ED Provider Notes (Signed)
 CAY RALPH PELT    CSN: [id number] Arrival date & time: 08/23/23  1109      History   Chief Complaint Chief Complaint  Patient presents with   URI    HPI Christopher Rich is a 33 y.o. male.  Patient presents with 5-day history of fever, headache, fatigue, sore throat, congestion, cough.  OTC medication taken today.  He has been taking ibuprofen  and DayQuil for his symptoms previously.  He reports Tmax 101.  No shortness of breath, vomiting, diarrhea.  He denies pertinent medical history.  The history is provided by the patient and medical records.    History reviewed. No pertinent past medical history.  There are no active problems to display for this patient.   Past Surgical History:  Procedure Laterality Date   FACIAL FRACTURE SURGERY     OPEN REDUCTION INTERNAL FIXATION (ORIF) METACARPAL Right 01/20/2015   Procedure: OPEN REDUCTION INTERNAL FIXATION (ORIF) Right index proximal pharanx;  Surgeon: Norleen JINNY Maltos, MD;  Location: [facility] ORS;  Service: Orthopedics;  Laterality: Right;       Home Medications    Prior to Admission medications   Medication Sig Start Date End Date Taking? Authorizing Provider  azithromycin  (ZITHROMAX ) 250 MG tablet Take 1 tablet (250 mg total) by mouth daily. Take first 2 tablets together, then 1 every day until finished. 08/23/23  Yes Corlis Burnard DEL, NP  predniSONE  (STERAPRED UNI-PAK 21 TAB) 10 MG (21) TBPK tablet Take by mouth daily. Take 6 tabs by mouth daily for 1 day, then 5 tabs for 1 day, then 4 tabs for 1 day, then 3 tabs for 1 day, then 2 tabs for 1 day, then 1 tab by mouth daily for 1 day Patient not taking: Reported on 08/23/2023 01/24/23   Immordino, Garnette, FNP    Family History History reviewed. No pertinent family history.  Social History Social History   Tobacco Use   Smoking status: Never   Smokeless tobacco: Never  Vaping Use   Vaping status: Never Used  Substance Use Topics   Alcohol use: Yes   Drug use: No      Allergies   Patient has no known allergies.   Review of Systems Review of Systems  Constitutional:  Positive for fatigue and fever. Negative for chills.  HENT:  Positive for congestion and sore throat. Negative for ear pain.   Respiratory:  Positive for cough. Negative for shortness of breath.   Gastrointestinal:  Negative for diarrhea and vomiting.  Neurological:  Positive for headaches.     Physical Exam Triage Vital Signs ED Triage Vitals  Encounter Vitals Group     BP 08/23/23 1206 125/88     Systolic BP Percentile --      Diastolic BP Percentile --      Pulse Rate 08/23/23 1206 98     Resp 08/23/23 1206 18     Temp 08/23/23 1206 99.6 F (37.6 C)     Temp src --      SpO2 08/23/23 1206 98 %     Weight --      Height --      Head Circumference --      Peak Flow --      Pain Score 08/23/23 1203 5     Pain Loc --      Pain Education --      Exclude from Growth Chart --    No data found.  Updated Vital Signs BP 125/88   Pulse 98  Temp 99.6 F (37.6 C)   Resp 18   SpO2 98%   Visual Acuity Right Eye Distance:   Left Eye Distance:   Bilateral Distance:    Right Eye Near:   Left Eye Near:    Bilateral Near:     Physical Exam Constitutional:      General: He is not in acute distress. HENT:     Right Ear: Tympanic membrane normal.     Left Ear: Tympanic membrane normal.     Nose: Rhinorrhea present.     Mouth/Throat:     Mouth: Mucous membranes are moist.     Pharynx: Oropharynx is clear.  Cardiovascular:     Rate and Rhythm: Normal rate and regular rhythm.     Heart sounds: Normal heart sounds.  Pulmonary:     Effort: Pulmonary effort is normal. No respiratory distress.     Breath sounds: Normal breath sounds.  Neurological:     Mental Status: He is alert.      UC Treatments / Results  Labs (all labs ordered are listed, but only abnormal results are displayed) Labs Reviewed  POCT RAPID STREP A (OFFICE)  POC COVID19/FLU A&B COMBO     EKG   Radiology No results found.  Procedures Procedures (including critical care time)  Medications Ordered in UC Medications - No data to display  Initial Impression / Assessment and Plan / UC Course  I have reviewed the triage vital signs and the nursing notes.  Pertinent labs & imaging results that were available during my care of the patient were reviewed by me and considered in my medical decision making (see chart for details).    Acute upper respiratory infection.  Rapid strep negative.  Rapid flu and COVID negative.  Lungs are clear and O2 sat is 98% on room air.  Patient is on day 5 of his symptoms.  Treating with Zithromax .  Education provided on upper respiratory infection.  Patient does not currently have a PCP and declines assistance with scheduling an appointment.  ED precautions given.  He agrees to plan of care.  Final Clinical Impressions(s) / UC Diagnoses   Final diagnoses:  Acute upper respiratory infection     Discharge Instructions      Take the Zithromax  as directed.    Go to the emergency department if you have worsening symptoms.        ED Prescriptions     Medication Sig Dispense Auth. Provider   azithromycin  (ZITHROMAX ) 250 MG tablet Take 1 tablet (250 mg total) by mouth daily. Take first 2 tablets together, then 1 every day until finished. 6 tablet Corlis Burnard DEL, NP      PDMP not reviewed this encounter.   Corlis Burnard DEL, NP 08/23/23 1236

## 2023-08-23 NOTE — Discharge Instructions (Addendum)
 Take the Zithromax  as directed.    Go to the emergency department if you have worsening symptoms.

## 2023-08-23 NOTE — ED Triage Notes (Signed)
 Patient to Urgent Care with complaints of fevers/ sore throat/ productive cough/ nasal congestion/ headaches/ fatigue.  Fevers x5 days/ other symptoms  x7 days.   Meds: ibuprofen / dayquil
